# Patient Record
Sex: Male | Born: 1975 | Race: White | Hispanic: No | Marital: Married | Smoking: Never smoker
Health system: Southern US, Community
[De-identification: ages and names within clinical notes are randomized; demographics above are authoritative.]

## PROBLEM LIST (undated history)

## (undated) DIAGNOSIS — E079 Disorder of thyroid, unspecified: Secondary | ICD-10-CM

## (undated) DIAGNOSIS — T7840XA Allergy, unspecified, initial encounter: Secondary | ICD-10-CM

## (undated) HISTORY — PX: TONSILLECTOMY: SUR1361

## (undated) HISTORY — DX: Allergy, unspecified, initial encounter: T78.40XA

## (undated) HISTORY — PX: MANDIBLE SURGERY: SHX707

## (undated) HISTORY — DX: Disorder of thyroid, unspecified: E07.9

---

## 2005-07-15 ENCOUNTER — Encounter: Admission: RE | Admit: 2005-07-15 | Discharge: 2005-07-15 | Payer: Self-pay | Admitting: Family Medicine

## 2012-05-23 HISTORY — PX: BICEPT TENODESIS: SHX5116

## 2013-04-26 ENCOUNTER — Encounter: Payer: Self-pay | Admitting: Gastroenterology

## 2013-06-06 ENCOUNTER — Encounter: Payer: Self-pay | Admitting: Gastroenterology

## 2013-06-06 ENCOUNTER — Ambulatory Visit (INDEPENDENT_AMBULATORY_CARE_PROVIDER_SITE_OTHER): Payer: BC Managed Care – PPO | Admitting: Gastroenterology

## 2013-06-06 VITALS — BP 84/50 | HR 64 | Ht 63.5 in | Wt 129.4 lb

## 2013-06-06 DIAGNOSIS — K5900 Constipation, unspecified: Secondary | ICD-10-CM

## 2013-06-06 MED ORDER — NA SULFATE-K SULFATE-MG SULF 17.5-3.13-1.6 GM/177ML PO SOLN: 1.0000 | Freq: Once | ORAL | Status: DC

## 2013-06-06 NOTE — Progress Notes (Signed)
_                                                                                                                History of Present Illness: Larry Flores 38 year old white male referred for evaluation of constipation.  This has been a problem for years although it seems to be worsening.  He may go over a week without a bowel movement.  With constipation he feels bloated and slightly lethargic.  When he does move his bowels he has a sense of incomplete relaxation.  He denies rectal bleeding.  Family history is pertinent for father who had Crohn's colitis.    History reviewed. No pertinent past medical history. Past Surgical History  Procedure Laterality Date  . Bicept tenodesis Left 05/2012    reattachment surgery  . Mandible surgery    . Tonsillectomy     family history includes Colitis in his father; Crohn's disease in his father; Lung cancer in his maternal grandmother, paternal grandfather, and paternal grandmother; Other in his father and paternal uncle. Current Outpatient Prescriptions  Medication Sig Dispense Refill  . b complex vitamins tablet Take 1 tablet by mouth daily.      . Cholecalciferol (VITAMIN D-3) 5000 UNITS TABS Take 1 tablet by mouth daily.      . Misc Natural Products (TURMERIC CURCUMIN) CAPS Take 1 capsule by mouth daily.      . Pregnenolone POWD 1 tablet by Does not apply route daily.      . vitamin A 10000 UNIT capsule Take 10,000 Units by mouth daily.      . Zinc Acetate, Oral, (ZINC ACETATE PO) Take 1 tablet by mouth daily.       No current facility-administered medications for this visit.   Allergies as of 06/06/2013  . (No Known Allergies)    reports that he has never smoked. He has never used smokeless tobacco. He reports that he drinks alcohol. He reports that he does not use illicit drugs.     Review of Systems: Pertinent positive and negative review of systems were noted in the above HPI section. All other review of systems were  otherwise negative.  Vital signs were reviewed in today's medical record Physical Exam: General: Well developed , well nourished, no acute distress Skin: anicteric Head: Normocephalic and atraumatic Eyes:  sclerae anicteric, EOMI Ears: Normal auditory acuity Mouth: No deformity or lesions Neck: Supple, no masses or thyromegaly Lungs: Clear throughout to auscultation Heart: Regular rate and rhythm; no murmurs, rubs or bruits Abdomen: Soft, non tender and non distended. No masses, hepatosplenomegaly or hernias noted. Normal Bowel sounds Rectal:deferred Musculoskeletal: Symmetrical with no gross deformities  Skin: No lesions on visible extremities Pulses:  Normal pulses noted Extremities: No clubbing, cyanosis, edema or deformities noted Neurological: Alert oriented x 4, grossly nonfocal Cervical Nodes:  No significant cervical adenopathy Inguinal Nodes: No significant inguinal adenopathy Psychological:  Alert and cooperative. Normal mood and affect  See Assessment and Plan under Problem List

## 2013-06-06 NOTE — Assessment & Plan Note (Signed)
Clear history of constipation.  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  A structural abnormality of the colon should be ruled out, especially in view of family history of Crohn's colitis.  Recommendations #1 colonoscopy; if no abnormalities are seen would consider enrollment in CIC. trial

## 2013-06-06 NOTE — Patient Instructions (Signed)

## 2013-06-18 ENCOUNTER — Encounter: Payer: Self-pay | Admitting: Gastroenterology

## 2013-06-26 ENCOUNTER — Encounter: Payer: BC Managed Care – PPO | Admitting: Gastroenterology

## 2013-08-06 ENCOUNTER — Telehealth: Payer: Self-pay | Admitting: Gastroenterology

## 2013-08-06 NOTE — Telephone Encounter (Signed)
Yes unless we were able to fill that slot

## 2013-08-07 ENCOUNTER — Encounter: Payer: BC Managed Care – PPO | Admitting: Gastroenterology

## 2013-09-12 ENCOUNTER — Encounter: Payer: Self-pay | Admitting: Gastroenterology

## 2013-09-12 ENCOUNTER — Ambulatory Visit (AMBULATORY_SURGERY_CENTER): Payer: BC Managed Care – PPO | Admitting: Gastroenterology

## 2013-09-12 VITALS — BP 114/69 | HR 58 | Temp 98.1°F | Resp 21 | Ht 63.0 in | Wt 129.0 lb

## 2013-09-12 DIAGNOSIS — K5900 Constipation, unspecified: Secondary | ICD-10-CM

## 2013-09-12 MED ORDER — SODIUM CHLORIDE 0.9 % IV SOLN: 500.0000 mL | INTRAVENOUS | Status: DC

## 2013-09-12 NOTE — Progress Notes (Signed)
A/ox3, pleased with MAC, report to RN 

## 2013-09-12 NOTE — Op Note (Signed)
[facility]  [address]. [place], [address]   COLONOSCOPY PROCEDURE REPORT  PATIENT: Larry Flores, Larry Flores.  MR#: [id number] BIRTHDATE: 05/23/1975 , 38  yrs. old GENDER: Male ENDOSCOPIST: Inda Castle, MD REFERRED BY: PROCEDURE DATE:  09/12/2013 PROCEDURE:   Colonoscopy, diagnostic First Screening Colonoscopy - Avg.  risk and is 50 yrs.  old or older Yes.  Prior Negative Screening - Now for repeat screening. N/A  History of Adenoma - Now for follow-up colonoscopy & has been > or = to 3 yrs.  N/A  Polyps Removed Today? No.  Recommend repeat exam, <10 yrs? No. ASA CLASS:   Class I INDICATIONS:Constipation. MEDICATIONS: MAC sedation, administered by CRNA and propofol (Diprivan) 200mg  IV  DESCRIPTION OF PROCEDURE:   After the risks benefits and alternatives of the procedure were thoroughly explained, informed consent was obtained.  A digital rectal exam revealed no abnormalities of the rectum.   The [id number]  endoscope was introduced through the anus and advanced to the ileum. No adverse events experienced.   The quality of the prep was excellent using Suprep  The instrument was then slowly withdrawn as the colon was fully examined.      COLON FINDINGS: The mucosa appeared normal in the terminal ileum. A normal appearing cecum, ileocecal valve, and appendiceal orifice were identified.  The ascending, hepatic flexure, transverse, splenic flexure, descending, sigmoid colon and rectum appeared unremarkable.  No polyps or cancers were seen.  Retroflexed views revealed no abnormalities. The time to cecum=3 minutes 49 seconds. Withdrawal time=6 minutes 18 seconds.  The scope was withdrawn and the procedure completed. COMPLICATIONS: There were no complications.  ENDOSCOPIC IMPRESSION: 1.   Normal mucosa in the terminal ileum 2.   Normal colon  RECOMMENDATIONS: High fiber diet with liberal fluid intake. MiraLax every 3 days if no bowel movement Office  visit 2 months   eSigned:  Inda Castle, MD 09/12/2013 2:58 PM   cc: Tomma Lightning Copy Erline Levine, MD   PATIENT NAME:  Larry Flores, Larry Flores. MR#: [id number]

## 2013-09-12 NOTE — Patient Instructions (Signed)
YOU HAD AN ENDOSCOPIC PROCEDURE TODAY AT THE [facility]: Refer to the procedure report that was given to you for any specific questions about what was found during the examination.  If the procedure report does not answer your questions, please call your gastroenterologist to clarify.  If you requested that your care partner not be given the details of your procedure findings, then the procedure report has been included in a sealed envelope for you to review at your convenience later.  YOU SHOULD EXPECT: Some feelings of bloating in the abdomen. Passage of more gas than usual.  Walking can help get rid of the air that was put into your GI tract during the procedure and reduce the bloating. If you had a lower endoscopy (such as a colonoscopy or flexible sigmoidoscopy) you may notice spotting of blood in your stool or on the toilet paper. If you underwent a bowel prep for your procedure, then you may not have a normal bowel movement for a few days.  DIET: Your first meal following the procedure should be a light meal and then it is ok to progress to your normal diet.  A half-sandwich or bowl of soup is an example of a good first meal.  Heavy or fried foods are harder to digest and may make you feel nauseous or bloated.  Likewise meals heavy in dairy and vegetables can cause extra gas to form and this can also increase the bloating.  Drink plenty of fluids but you should avoid alcoholic beverages for 24 hours.  ACTIVITY: Your care partner should take you home directly after the procedure.  You should plan to take it easy, moving slowly for the rest of the day.  You can resume normal activity the day after the procedure however you should NOT DRIVE or use heavy machinery for 24 hours (because of the sedation medicines used during the test).    SYMPTOMS TO REPORT IMMEDIATELY: A gastroenterologist can be reached at any hour.  During normal business hours, 8:30 AM to 5:00 PM Monday through Friday,  call [phone].  After hours and on weekends, please call the GI answering service at [phone] who will take a message and have the physician on call contact you.   Following lower endoscopy (colonoscopy or flexible sigmoidoscopy):  Excessive amounts of blood in the stool  Significant tenderness or worsening of abdominal pains  Swelling of the abdomen that is new, acute  Fever of 100F or higher    FOLLOW UP: If any biopsies were taken you will be contacted by phone or by letter within the next 1-3 weeks.  Call your gastroenterologist if you have not heard about the biopsies in 3 weeks.  Our staff will call the home number listed on your records the next business day following your procedure to check on you and address any questions or concerns that you may have at that time regarding the information given to you following your procedure. This is a courtesy call and so if there is no answer at the home number and we have not heard from you through the emergency physician on call, we will assume that you have returned to your regular daily activities without incident.  SIGNATURES/CONFIDENTIALITY: You and/or your care partner have signed paperwork which will be entered into your electronic medical record.  These signatures attest to the fact that that the information above on your After Visit Summary has been reviewed and is understood.  Full responsibility of the confidentiality   of this discharge information lies with you and/or your care-partner.   HIGH FIBER DIET INFORMATION GIVEN TO YOU TODAY  MIRALAX (OVER THE COUNTER) AS DIRECTED EVERY 3 DAYS IF NO BOWEL MOVEMENT   OFFICE VISIT WITH DR Deatra Ina IN 2 [facility] APPOINT

## 2013-09-13 ENCOUNTER — Telehealth: Payer: Self-pay | Admitting: *Deleted

## 2013-09-13 NOTE — Telephone Encounter (Signed)
Message left

## 2013-11-19 ENCOUNTER — Telehealth: Payer: Self-pay | Admitting: *Deleted

## 2013-11-19 NOTE — Telephone Encounter (Signed)
L./M FOR PATIENT TO CALL BACK AND RESCHEDULE HIS APPOINTMENT     DUE TO ERCP SCHEDULED AT [facility] PER DR PYRTLE

## 2013-11-21 ENCOUNTER — Ambulatory Visit: Payer: BC Managed Care – PPO | Admitting: Gastroenterology

## 2016-08-06 ENCOUNTER — Other Ambulatory Visit: Payer: Self-pay | Admitting: Family Medicine

## 2016-08-06 DIAGNOSIS — M25511 Pain in right shoulder: Secondary | ICD-10-CM

## 2016-08-17 ENCOUNTER — Ambulatory Visit
Admission: RE | Admit: 2016-08-17 | Discharge: 2016-08-17 | Disposition: A | Discharge status: Home / Self Care | Payer: BLUE CROSS/BLUE SHIELD | Source: Ambulatory Visit | Attending: Family Medicine | Admitting: Family Medicine

## 2016-08-17 DIAGNOSIS — M25511 Pain in right shoulder: Secondary | ICD-10-CM

## 2016-11-16 DIAGNOSIS — M75121 Complete rotator cuff tear or rupture of right shoulder, not specified as traumatic: Secondary | ICD-10-CM | POA: Insufficient documentation

## 2016-11-16 DIAGNOSIS — M24111 Other articular cartilage disorders, right shoulder: Secondary | ICD-10-CM | POA: Insufficient documentation

## 2016-11-16 DIAGNOSIS — M67921 Unspecified disorder of synovium and tendon, right upper arm: Secondary | ICD-10-CM | POA: Insufficient documentation

## 2017-01-10 DIAGNOSIS — M7501 Adhesive capsulitis of right shoulder: Secondary | ICD-10-CM | POA: Insufficient documentation

## 2017-02-22 HISTORY — PX: OTHER SURGICAL HISTORY: SHX169

## 2019-02-19 ENCOUNTER — Ambulatory Visit: Payer: BC Managed Care – PPO | Attending: Internal Medicine

## 2019-02-19 DIAGNOSIS — Z20822 Contact with and (suspected) exposure to covid-19: Secondary | ICD-10-CM

## 2019-02-21 LAB — NOVEL CORONAVIRUS, NAA: SARS-CoV-2, NAA: NOT DETECTED

## 2019-07-25 DIAGNOSIS — S62617D Displaced fracture of proximal phalanx of left little finger, subsequent encounter for fracture with routine healing: Secondary | ICD-10-CM | POA: Diagnosis not present

## 2019-08-22 DIAGNOSIS — S62617D Displaced fracture of proximal phalanx of left little finger, subsequent encounter for fracture with routine healing: Secondary | ICD-10-CM | POA: Diagnosis not present

## 2020-01-14 DIAGNOSIS — E039 Hypothyroidism, unspecified: Secondary | ICD-10-CM | POA: Diagnosis not present

## 2020-01-14 DIAGNOSIS — E291 Testicular hypofunction: Secondary | ICD-10-CM | POA: Diagnosis not present

## 2020-01-14 DIAGNOSIS — E538 Deficiency of other specified B group vitamins: Secondary | ICD-10-CM | POA: Diagnosis not present

## 2020-01-14 DIAGNOSIS — E559 Vitamin D deficiency, unspecified: Secondary | ICD-10-CM | POA: Diagnosis not present

## 2020-02-13 DIAGNOSIS — Z20822 Contact with and (suspected) exposure to covid-19: Secondary | ICD-10-CM | POA: Diagnosis not present

## 2020-03-12 DIAGNOSIS — Z0001 Encounter for general adult medical examination with abnormal findings: Secondary | ICD-10-CM | POA: Diagnosis not present

## 2020-03-12 DIAGNOSIS — R945 Abnormal results of liver function studies: Secondary | ICD-10-CM | POA: Diagnosis not present

## 2020-03-12 DIAGNOSIS — E291 Testicular hypofunction: Secondary | ICD-10-CM | POA: Diagnosis not present

## 2020-03-12 DIAGNOSIS — E7212 Methylenetetrahydrofolate reductase deficiency: Secondary | ICD-10-CM | POA: Diagnosis not present

## 2021-04-03 DIAGNOSIS — R945 Abnormal results of liver function studies: Secondary | ICD-10-CM | POA: Diagnosis not present

## 2021-04-15 DIAGNOSIS — Z20822 Contact with and (suspected) exposure to covid-19: Secondary | ICD-10-CM | POA: Diagnosis not present

## 2021-04-15 DIAGNOSIS — J029 Acute pharyngitis, unspecified: Secondary | ICD-10-CM | POA: Diagnosis not present

## 2021-04-15 DIAGNOSIS — J3489 Other specified disorders of nose and nasal sinuses: Secondary | ICD-10-CM | POA: Diagnosis not present

## 2021-04-15 DIAGNOSIS — R0981 Nasal congestion: Secondary | ICD-10-CM | POA: Diagnosis not present

## 2021-04-23 DIAGNOSIS — F4329 Adjustment disorder with other symptoms: Secondary | ICD-10-CM | POA: Diagnosis not present

## 2021-04-23 DIAGNOSIS — E291 Testicular hypofunction: Secondary | ICD-10-CM | POA: Diagnosis not present

## 2021-04-23 DIAGNOSIS — E039 Hypothyroidism, unspecified: Secondary | ICD-10-CM | POA: Diagnosis not present

## 2021-04-23 DIAGNOSIS — R5383 Other fatigue: Secondary | ICD-10-CM | POA: Diagnosis not present

## 2021-04-23 DIAGNOSIS — R140 Abdominal distension (gaseous): Secondary | ICD-10-CM | POA: Diagnosis not present

## 2021-04-23 DIAGNOSIS — Z0001 Encounter for general adult medical examination with abnormal findings: Secondary | ICD-10-CM | POA: Diagnosis not present

## 2021-05-07 DIAGNOSIS — D72829 Elevated white blood cell count, unspecified: Secondary | ICD-10-CM | POA: Diagnosis not present

## 2021-07-10 ENCOUNTER — Encounter: Payer: Self-pay | Admitting: Family Medicine

## 2021-07-10 ENCOUNTER — Ambulatory Visit (INDEPENDENT_AMBULATORY_CARE_PROVIDER_SITE_OTHER): Payer: BC Managed Care – PPO

## 2021-07-10 ENCOUNTER — Ambulatory Visit (INDEPENDENT_AMBULATORY_CARE_PROVIDER_SITE_OTHER): Payer: BC Managed Care – PPO | Admitting: Family Medicine

## 2021-07-10 VITALS — BP 110/80 | HR 92 | Ht 63.0 in | Wt 130.4 lb

## 2021-07-10 DIAGNOSIS — S92213A Displaced fracture of cuboid bone of unspecified foot, initial encounter for closed fracture: Secondary | ICD-10-CM | POA: Insufficient documentation

## 2021-07-10 DIAGNOSIS — S99911A Unspecified injury of right ankle, initial encounter: Secondary | ICD-10-CM | POA: Diagnosis not present

## 2021-07-10 DIAGNOSIS — S92214A Nondisplaced fracture of cuboid bone of right foot, initial encounter for closed fracture: Secondary | ICD-10-CM

## 2021-07-10 DIAGNOSIS — M79671 Pain in right foot: Secondary | ICD-10-CM

## 2021-07-10 NOTE — Patient Instructions (Addendum)
Nice to meet you.  Please look into a CAM walker boot and an ASO ankle brace.  Follow-up: 2 weeks

## 2021-07-10 NOTE — Progress Notes (Signed)
   I, Wendy Poet, LAT, ATC, am serving as scribe for Dr. Lynne Leader.  Subjective:    CC: R foot pain  HPI: Pt is a 46 y/o male c/o R foot pain since 07/09/21 when he injured his foot while playing soccer. He rolled his R ankle and foot into inversion and heard/felt a pop. Pt locates pain to his R lateral, proximal foot near the cuboid/proximal 5th MT.   R foot swelling: yes Aggravating factors: walking/weight-bearing; R toe ext especially toes 4 and 5 Treatments tried: ice; IBU  Pertinent review of Systems: No fever or chills  Relevant historical information: History of right adhesive capsulitis   Objective:    Vitals:   07/10/21 1135  BP: 110/80  Pulse: 92  SpO2: 97%   General: Well Developed, well nourished, and in no acute distress.   MSK: Right foot swelling dorsal lateral midfoot otherwise normal. Normal foot and ankle motion. Tender palpation overlying cuboid. Stable ligamentous exam. Intact strength.  Pain with resisted foot eversion.  Lab and Radiology Results  X-ray images right foot and ankle obtained today personally and independently interpreted  Right ankle: No acute fractures are visible.  No significant degenerative changes.  Right foot: Avulsion fracture present cuboid laterally.  Age-indeterminate.  Await formal radiology review    Impression and Recommendations:    Assessment and Plan: 46 y.o. male with right lateral foot pain after an inversion injury.  X-ray is concerning for an avulsion fracture however the edges of the avulsion fragment are rounded which may indicate this is an old injury.  He could have had an exacerbation of an old avulsion fracture or just an unusual appearing new avulsion fracture.  Regardless his pain does correspond to the avulsion fragment seen on x-ray.  We will treat as though a cuboid avulsion fracture with Cam walker.  Recheck in 2 weeks.  Recommend transitioning to ASO ankle brace when able.   PDMP not reviewed  this encounter. Orders Placed This Encounter  Procedures   DG Ankle Complete Right    Standing Status:   Future    Number of Occurrences:   1    Standing Expiration Date:   08/10/2021    Order Specific Question:   Reason for Exam (SYMPTOM  OR DIAGNOSIS REQUIRED)    Answer:   R foot pain    Order Specific Question:   Preferred imaging location?    Answer:   [facility]   DG Foot Complete Right    Standing Status:   Future    Number of Occurrences:   1    Standing Expiration Date:   08/10/2021    Order Specific Question:   Reason for Exam (SYMPTOM  OR DIAGNOSIS REQUIRED)    Answer:   R foot pain    Order Specific Question:   Preferred imaging location?    Answer:   [facility]   No orders of the defined types were placed in this encounter.   Discussed warning signs or symptoms. Please see discharge instructions. Patient expresses understanding.   The above documentation has been reviewed and is accurate and complete Lynne Leader, M.D.

## 2021-07-13 NOTE — Progress Notes (Signed)
Right foot x-ray shows a cuboid fracture like we talked about in clinic.

## 2021-07-13 NOTE — Progress Notes (Signed)
Right ankle x-ray shows a suspected cuboid fracture like we talked about in clinic.

## 2021-07-24 ENCOUNTER — Ambulatory Visit: Payer: BC Managed Care – PPO | Admitting: Family Medicine

## 2021-12-03 ENCOUNTER — Encounter: Payer: Self-pay | Admitting: Gastroenterology

## 2021-12-17 ENCOUNTER — Telehealth: Payer: Self-pay | Admitting: *Deleted

## 2021-12-17 NOTE — Telephone Encounter (Signed)
Patient had normal colonoscopy at 38 to assess for constipation. Just wanted to verify it is ok for him to now come in for screening?

## 2021-12-17 NOTE — Telephone Encounter (Signed)
Chart reviewed.  Okay to proceed with colonoscopy for direct access for age-appropriate CRC screening

## 2021-12-23 ENCOUNTER — Ambulatory Visit (AMBULATORY_SURGERY_CENTER): Payer: Self-pay

## 2021-12-23 VITALS — Ht 64.0 in | Wt 129.0 lb

## 2021-12-23 DIAGNOSIS — Z1211 Encounter for screening for malignant neoplasm of colon: Secondary | ICD-10-CM

## 2021-12-23 MED ORDER — NA SULFATE-K SULFATE-MG SULF 17.5-3.13-1.6 GM/177ML PO SOLN: 1.0000 | Freq: Once | ORAL | 0 refills | Status: AC

## 2021-12-23 NOTE — Progress Notes (Signed)

## 2022-01-13 ENCOUNTER — Encounter: Payer: Self-pay | Admitting: Gastroenterology

## 2022-01-20 ENCOUNTER — Encounter: Payer: Self-pay | Admitting: Certified Registered Nurse Anesthetist

## 2022-01-21 ENCOUNTER — Encounter: Payer: Self-pay | Admitting: Gastroenterology

## 2022-01-21 ENCOUNTER — Ambulatory Visit (AMBULATORY_SURGERY_CENTER): Payer: BC Managed Care – PPO | Admitting: Gastroenterology

## 2022-01-21 VITALS — BP 113/66 | HR 66 | Temp 95.7°F | Resp 12 | Ht 64.0 in | Wt 129.0 lb

## 2022-01-21 DIAGNOSIS — D122 Benign neoplasm of ascending colon: Secondary | ICD-10-CM

## 2022-01-21 DIAGNOSIS — Z1211 Encounter for screening for malignant neoplasm of colon: Secondary | ICD-10-CM

## 2022-01-21 MED ORDER — SODIUM CHLORIDE 0.9 % IV SOLN: 500.0000 mL | INTRAVENOUS | Status: DC

## 2022-01-21 NOTE — Progress Notes (Signed)
Called to room to assist during endoscopic procedure.  Patient ID and intended procedure confirmed with present staff. Received instructions for my participation in the procedure from the performing physician.  

## 2022-01-21 NOTE — Op Note (Signed)
[facility] Patient Name: Larry Flores Procedure Date: 01/21/2022 9:42 AM MRN: [id number] Endoscopist: Gerrit Heck , MD, [id number] Age: 46 Referring MD:  Date of Birth: 03/30/1975 Gender: Male Account #: [id number] Procedure:                Colonoscopy Indications:              Screening for colorectal malignant neoplasm Medicines:                Monitored Anesthesia Care Procedure:                Pre-Anesthesia Assessment:                           - Prior to the procedure, a History and Physical                            was performed, and patient medications and                            allergies were reviewed. The patient's tolerance of                            previous anesthesia was also reviewed. The risks                            and benefits of the procedure and the sedation                            options and risks were discussed with the patient.                            All questions were answered, and informed consent                            was obtained. Prior Anticoagulants: The patient has                            taken no anticoagulant or antiplatelet agents. ASA                            Grade Assessment: II - A patient with mild systemic                            disease. After reviewing the risks and benefits,                            the patient was deemed in satisfactory condition to                            undergo the procedure.                           After obtaining informed consent, the colonoscope  was passed under direct vision. Throughout the                            procedure, the patient's blood pressure, pulse, and                            oxygen saturations were monitored continuously. The                            CF [id number] #[id number] was introduced through the anus                            and advanced to the the terminal ileum. The                            colonoscopy was  performed without difficulty. The                            patient tolerated the procedure well. The quality                            of the bowel preparation was good. The terminal                            ileum, ileocecal valve, appendiceal orifice, and                            rectum were photographed. Scope In: 9:44:01 AM Scope Out: 10:01:08 AM Scope Withdrawal Time: 0 hours 11 minutes 17 seconds  Total Procedure Duration: 0 hours 17 minutes 7 seconds  Findings:                 The perianal and digital rectal examinations were                            normal.                           A 6 mm polyp was found in the ascending colon. The                            polyp was sessile. The polyp was removed with a                            cold snare. Resection and retrieval were complete.                            Estimated blood loss was minimal.                           The sigmoid colon revealed significantly excessive                            looping. Advancing the scope required using manual  pressure.                           The retroflexed view of the distal rectum and anal                            verge was normal and showed no anal or rectal                            abnormalities.                           The terminal ileum appeared normal. Complications:            No immediate complications. Estimated Blood Loss:     Estimated blood loss was minimal. Impression:               - One 6 mm polyp in the ascending colon, removed                            with a cold snare. Resected and retrieved.                           - There was significant looping of the colon.                           - The distal rectum and anal verge are normal on                            retroflexion view.                           - The examined portion of the ileum was normal. Recommendation:           - Patient has a contact number available for                             emergencies. The signs and symptoms of potential                            delayed complications were discussed with the                            patient. Return to normal activities tomorrow.                            Written discharge instructions were provided to the                            patient.                           - Resume previous diet.                           - Continue present medications.                           -  Await pathology results.                           - Repeat colonoscopy for surveillance based on                            pathology results.                           - Return to GI office PRN. Gerrit Heck, MD 01/21/2022 10:06:06 AM

## 2022-01-21 NOTE — Progress Notes (Signed)
Report given to PACU, vss 

## 2022-01-21 NOTE — Progress Notes (Signed)
GASTROENTEROLOGY PROCEDURE H&P NOTE   Primary Care Physician: Zane Herald, MD    Reason for Procedure:  Colon Cancer screening  Plan:    Colonoscopy  Patient is appropriate for endoscopic procedure(s) in the ambulatory ([facility]) setting.  The nature of the procedure, as well as the risks, benefits, and alternatives were carefully and thoroughly reviewed with the patient. Ample time for discussion and questions allowed. The patient understood, was satisfied, and agreed to proceed.     HPI: Larry Flores is a 46 y.o. male who presents for colonoscopy for routine Colon Cancer screening.  No active GI symptoms.  Fhx notable for father w/ Crohns Diseas. No known family history of colon cancer or related malignancy.    Past Medical History:  Diagnosis Date   Allergy    Thyroid disease     Past Surgical History:  Procedure Laterality Date   BICEPT TENODESIS Left 05/23/2012   reattachment surgery   MANDIBLE SURGERY     rotater cuff Right 2019   TONSILLECTOMY      Prior to Admission medications   Medication Sig Start Date End Date Taking? Authorizing Provider  b complex vitamins tablet Take 1 tablet by mouth daily.   Yes [provider]  Cholecalciferol (VITAMIN D-3) 5000 UNITS TABS Take 1 tablet by mouth daily.   Yes [provider]  Misc Natural Products (TURMERIC CURCUMIN) CAPS Take 1 capsule by mouth daily.   Yes [provider]  NP THYROID 30 MG tablet Take 30 mg by mouth daily. 12/09/21  Yes [provider]  Pregnenolone POWD 1 tablet by Does not apply route daily.   Yes [provider]  Zinc Acetate, Oral, (ZINC ACETATE PO) Take 1 tablet by mouth daily.   Yes [provider]  testosterone cypionate (DEPOTESTOTERONE CYPIONATE) 100 MG/ML injection Inject into the muscle.    [provider]    Current Outpatient Medications  Medication Sig Dispense Refill   b complex vitamins tablet Take 1 tablet by mouth  daily.     Cholecalciferol (VITAMIN D-3) 5000 UNITS TABS Take 1 tablet by mouth daily.     Misc Natural Products (TURMERIC CURCUMIN) CAPS Take 1 capsule by mouth daily.     NP THYROID 30 MG tablet Take 30 mg by mouth daily.     Pregnenolone POWD 1 tablet by Does not apply route daily.     Zinc Acetate, Oral, (ZINC ACETATE PO) Take 1 tablet by mouth daily.     testosterone cypionate (DEPOTESTOTERONE CYPIONATE) 100 MG/ML injection Inject into the muscle.     Current Facility-Administered Medications  Medication Dose Route Frequency Provider Last Rate Last Admin   0.9 %  sodium chloride infusion  500 mL Intravenous Continuous Ellanor Feuerstein V, DO        Allergies as of 01/21/2022   (No Known Allergies)    Family History  Problem Relation Age of Onset   Crohn's disease Father    Colitis Father    Other Father        brain tumor   Other Paternal Uncle        brain tumor   Lung cancer Maternal Grandmother    Lung cancer Paternal Grandmother    Lung cancer Paternal Grandfather    Colon cancer Neg Hx    Colon polyps Neg Hx    Esophageal cancer Neg Hx    Stomach cancer Neg Hx    Rectal cancer Neg Hx     Social  History   Socioeconomic History   Marital status: Married    Spouse name: Not on file   Number of children: 2   Years of education: Not on file   Highest education level: Not on file  Occupational History   Occupation: Games developer: [employer]  Tobacco Use   Smoking status: Never   Smokeless tobacco: Never  Substance and Sexual Activity   Alcohol use: Yes    Comment: ocassional   Drug use: No   Sexual activity: Not on file  Other Topics Concern   Not on file  Social History Narrative   Not on file   Social Determinants of Health   Financial Resource Strain: Not on file  Food Insecurity: Not on file  Transportation Needs: Not on file  Physical Activity: Not on file  Stress: Not on file  Social Connections: Not on file  Intimate Partner Violence:  Not on file    Physical Exam: Vital signs in last 24 hours: '@BP'$  (!) 145/82   Pulse 78   Temp (!) 95.7 F (35.4 C)   Ht '5\' 4"'$  (1.626 m)   Wt 129 lb (58.5 kg)   SpO2 100%   BMI 22.14 kg/m  GEN: NAD EYE: Sclerae anicteric ENT: MMM CV: Non-tachycardic Pulm: CTA b/l GI: Soft, NT/ND NEURO:  Alert & Oriented x [place], DO [facility]   01/21/2022 9:33 AM

## 2022-01-21 NOTE — Patient Instructions (Signed)
Please read handouts provided. Continue present medications. Await pathology results. Return to GI office as needed.   YOU HAD AN ENDOSCOPIC PROCEDURE TODAY AT [facility]:   Refer to the procedure report that was given to you for any specific questions about what was found during the examination.  If the procedure report does not answer your questions, please call your gastroenterologist to clarify.  If you requested that your care partner not be given the details of your procedure findings, then the procedure report has been included in a sealed envelope for you to review at your convenience later.  YOU SHOULD EXPECT: Some feelings of bloating in the abdomen. Passage of more gas than usual.  Walking can help get rid of the air that was put into your GI tract during the procedure and reduce the bloating. If you had a lower endoscopy (such as a colonoscopy or flexible sigmoidoscopy) you may notice spotting of blood in your stool or on the toilet paper. If you underwent a bowel prep for your procedure, you may not have a normal bowel movement for a few days.  Please Note:  You might notice some irritation and congestion in your nose or some drainage.  This is from the oxygen used during your procedure.  There is no need for concern and it should clear up in a day or so.  SYMPTOMS TO REPORT IMMEDIATELY:  Following lower endoscopy (colonoscopy or flexible sigmoidoscopy):  Excessive amounts of blood in the stool  Significant tenderness or worsening of abdominal pains  Swelling of the abdomen that is new, acute  Fever of 100F or higher  For urgent or emergent issues, a gastroenterologist can be reached at any hour by calling [phone]. Do not use MyChart messaging for urgent concerns.    DIET:  We do recommend a small meal at first, but then you may proceed to your regular diet.  Drink plenty of fluids but you should avoid alcoholic beverages for 24 hours.  ACTIVITY:  You  should plan to take it easy for the rest of today and you should NOT DRIVE or use heavy machinery until tomorrow (because of the sedation medicines used during the test).    FOLLOW UP: Our staff will call the number listed on your records the next business day following your procedure.  We will call around 7:15- 8:00 am to check on you and address any questions or concerns that you may have regarding the information given to you following your procedure. If we do not reach you, we will leave a message.     If any biopsies were taken you will be contacted by phone or by letter within the next 1-3 weeks.  Please call us at [phone] if you have not heard about the biopsies in 3 weeks.    SIGNATURES/CONFIDENTIALITY: You and/or your care partner have signed paperwork which will be entered into your electronic medical record.  These signatures attest to the fact that that the information above on your After Visit Summary has been reviewed and is understood.  Full responsibility of the confidentiality of this discharge information lies with you and/or your care-partner.

## 2022-01-22 ENCOUNTER — Telehealth: Payer: Self-pay | Admitting: *Deleted

## 2022-01-22 NOTE — Telephone Encounter (Signed)
Attempted to call patient for their post-procedure follow-up call. No answer. Left voicemail.   

## 2022-01-29 ENCOUNTER — Encounter: Payer: Self-pay | Admitting: Gastroenterology

## 2023-11-29 IMAGING — DX DG FOOT COMPLETE 3+V*R*
3 series · 3 of 3 positions shown · non-contrast
Comparison: None Available.

CLINICAL DATA: Right foot pain

EXAM:
RIGHT FOOT COMPLETE - 3+ VIEW

[foot ap]
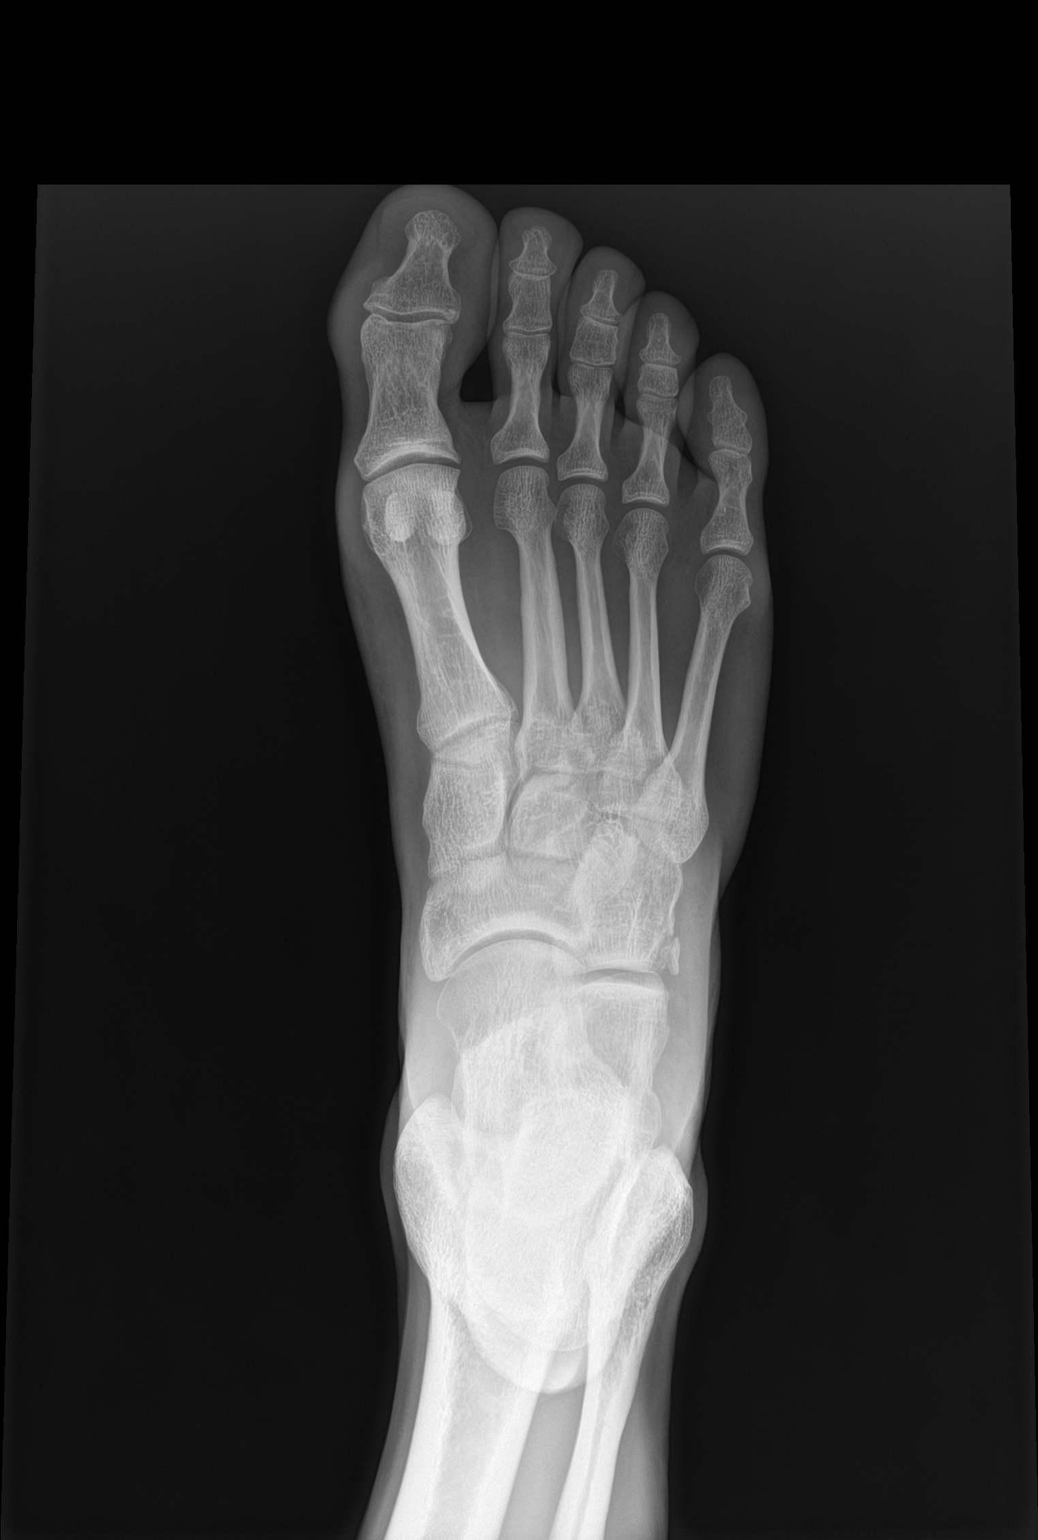

[foot obl]
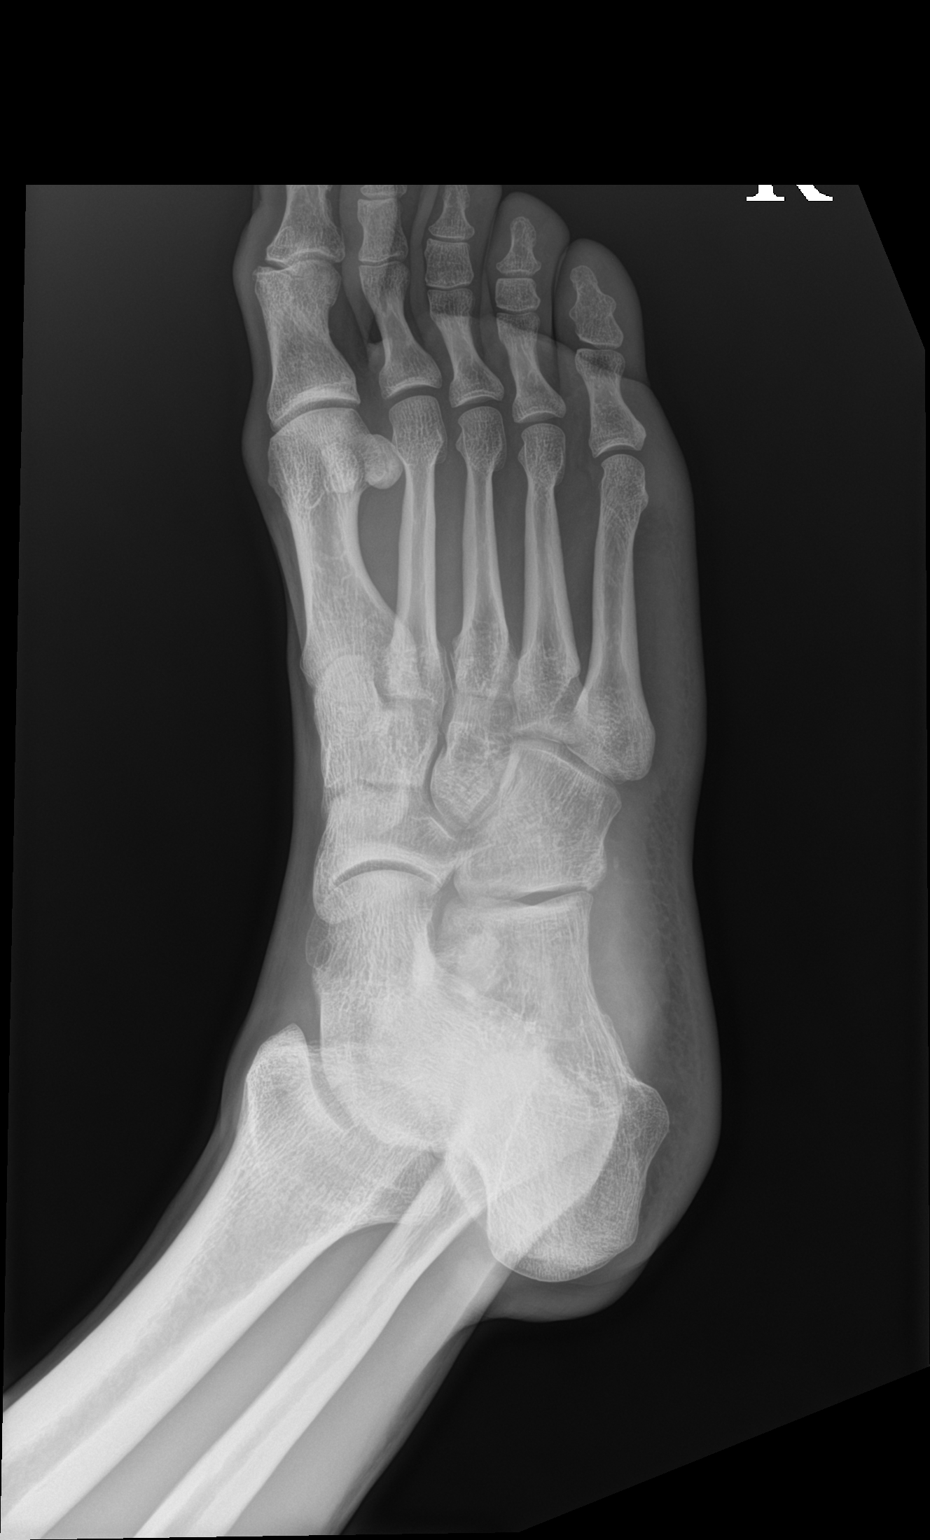

[foot lat]
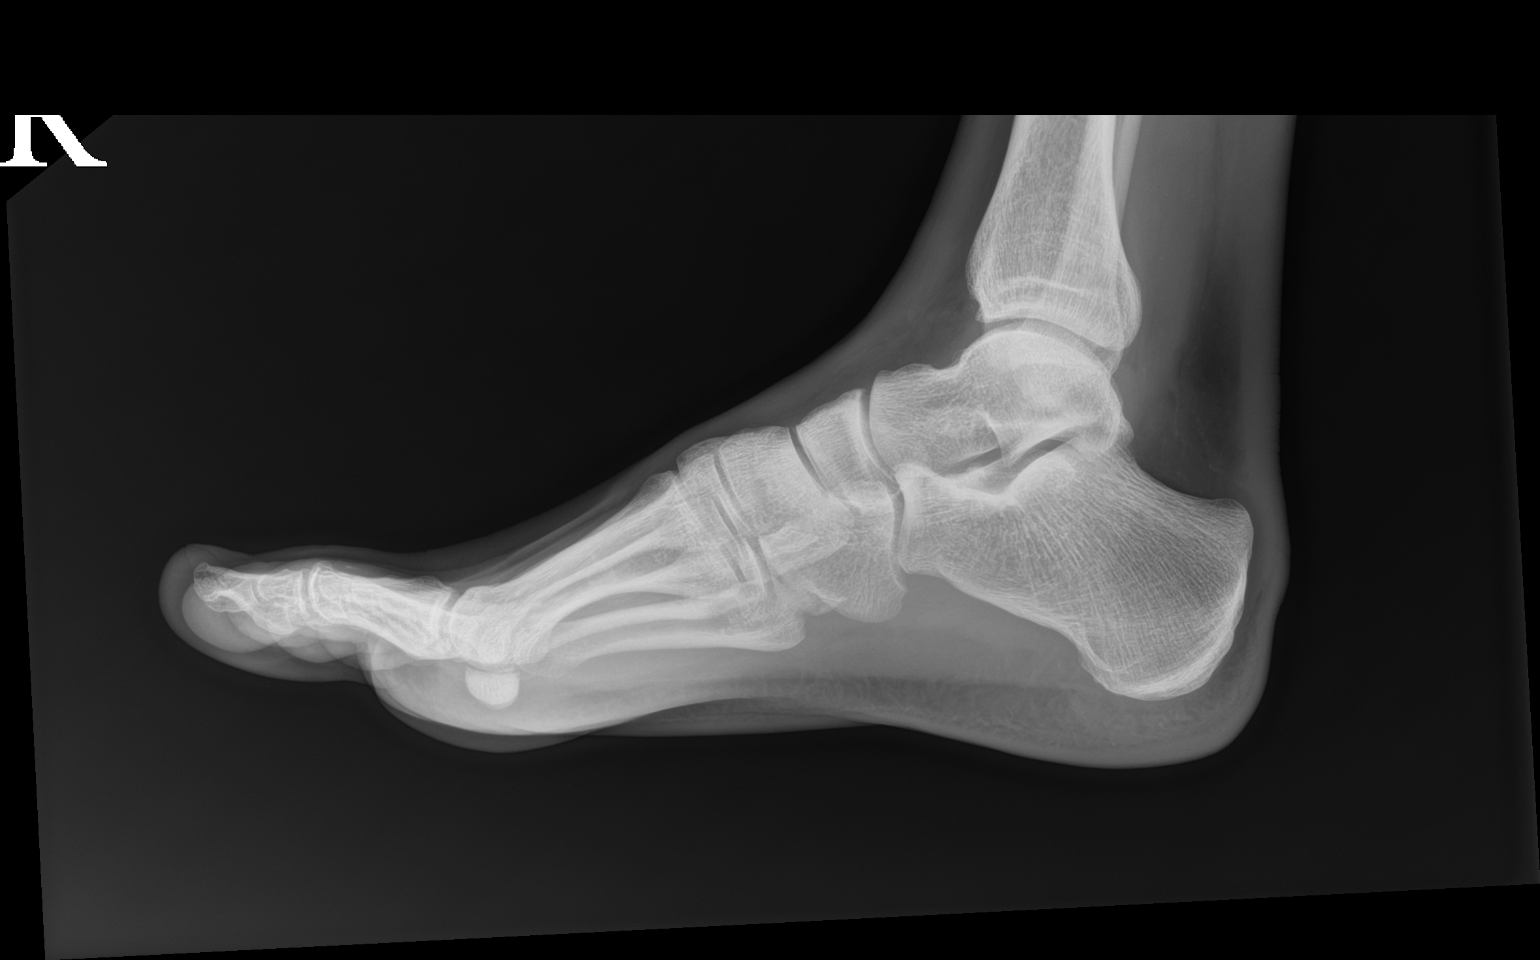

[3 of 3 positions shown; findings below may reference images not displayed]

FINDINGS: There is an acute appearing minimally displaced fracture at the
proximal lateral aspect of the cuboid bone. No additional fracture
or dislocation identified in the foot. No significant soft tissue
swelling visualized.
IMPRESSION: Acute appearing minimally displaced fracture at the lateral cuboid
bone, correlate with point tenderness.

These results will be called to the ordering clinician or
representative by the Radiologist Assistant, and communication
documented in the PACS or [REDACTED].

## 2023-11-29 IMAGING — DX DG ANKLE COMPLETE 3+V*R*
3 series · 3 of 3 positions shown · non-contrast
Comparison: None Available.

CLINICAL DATA: Right ankle injury

EXAM:
RIGHT ANKLE - COMPLETE 3+ VIEW

[ankle ap]
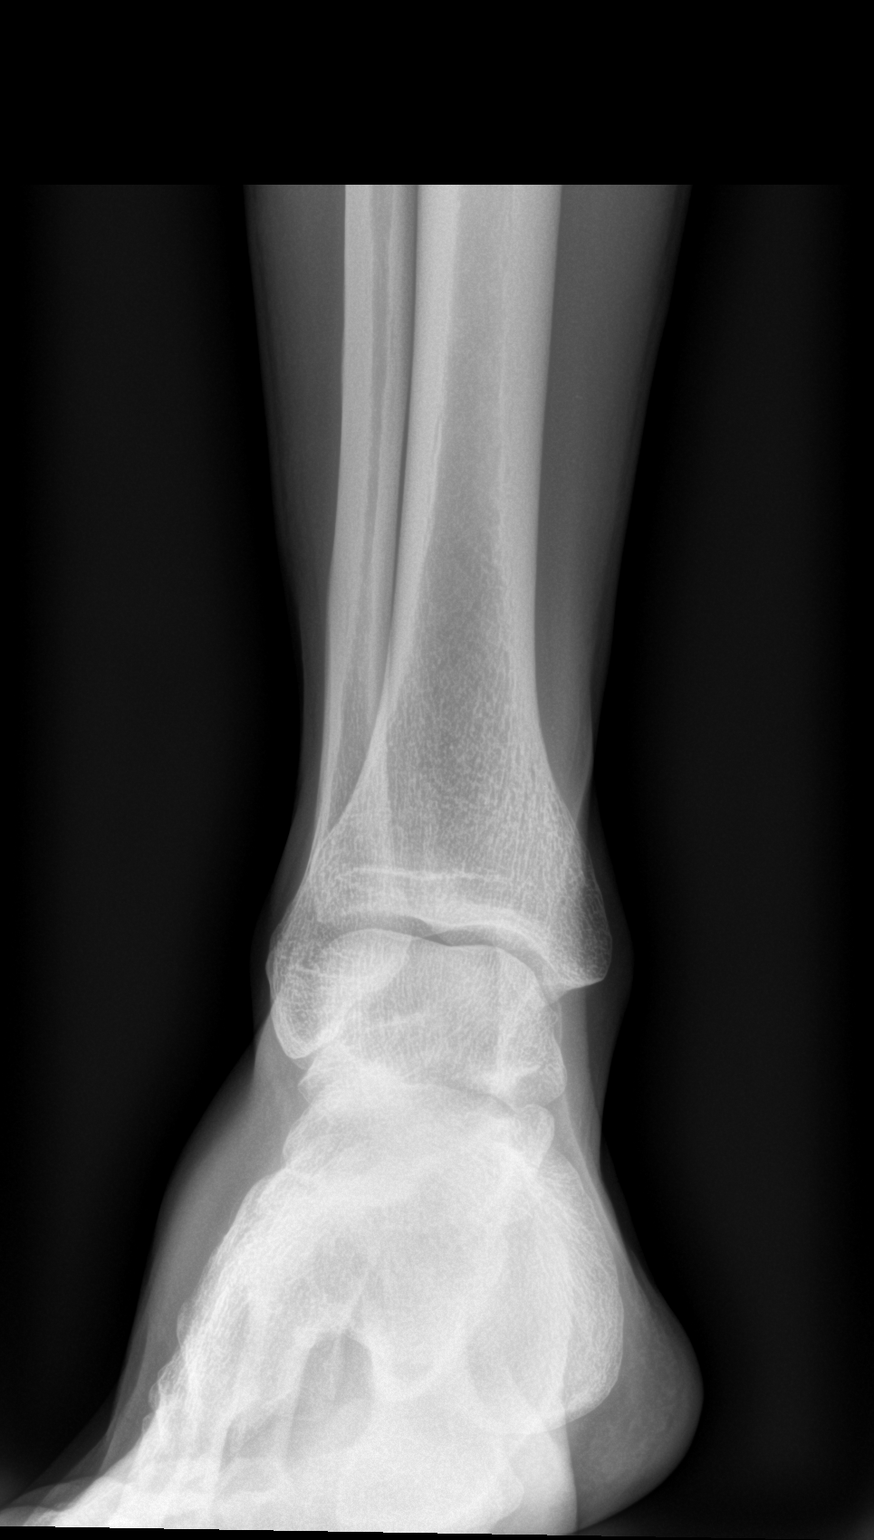

[ankle obl]
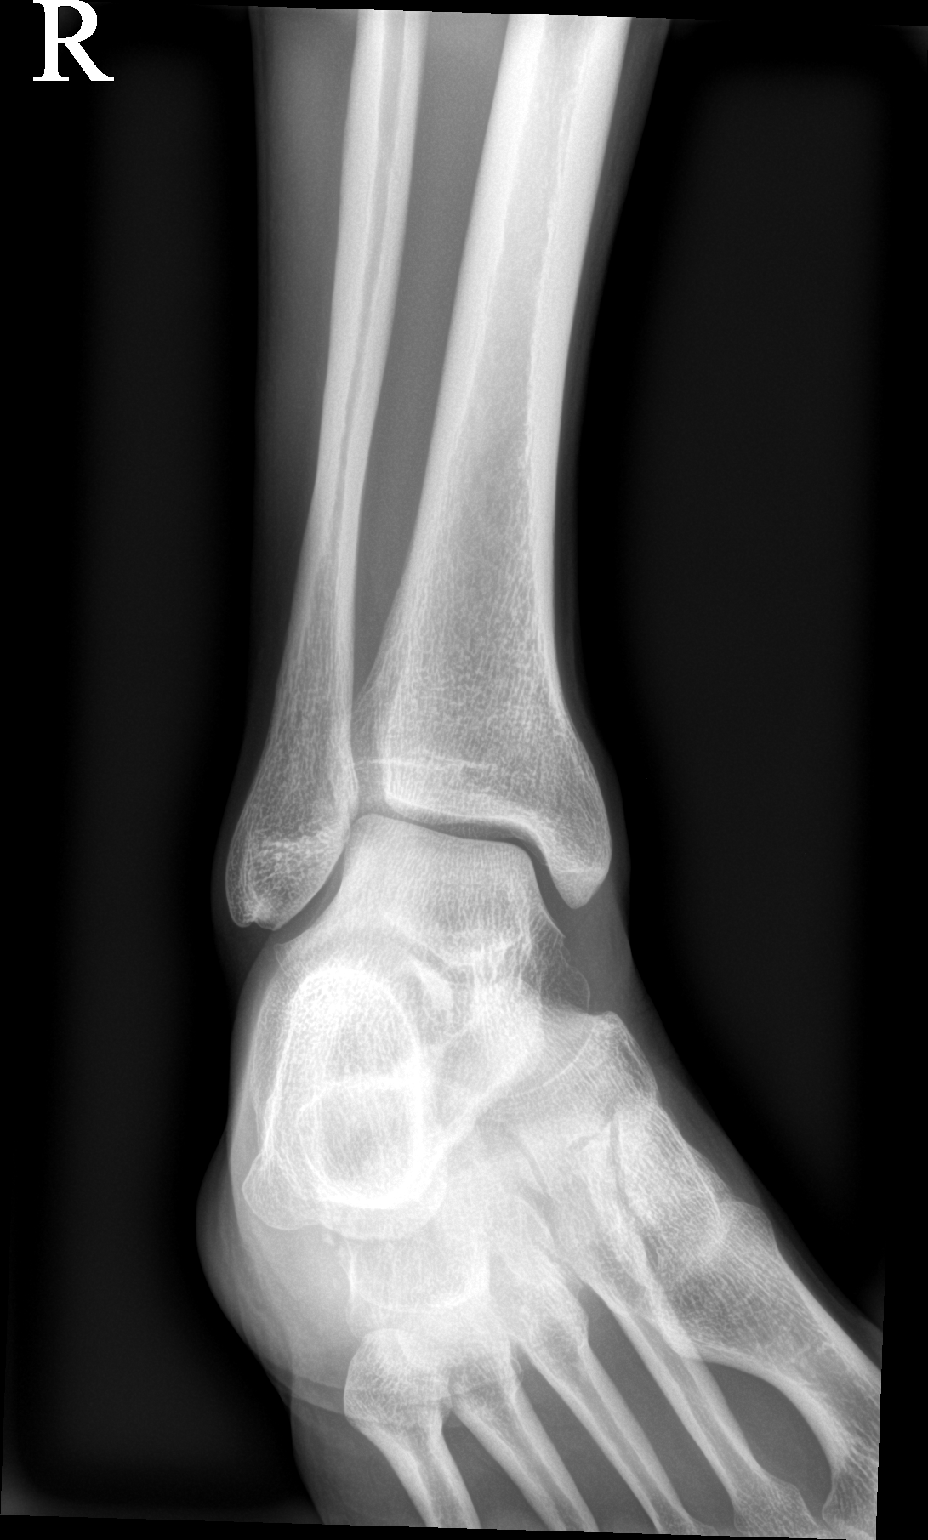

[ankle lat]
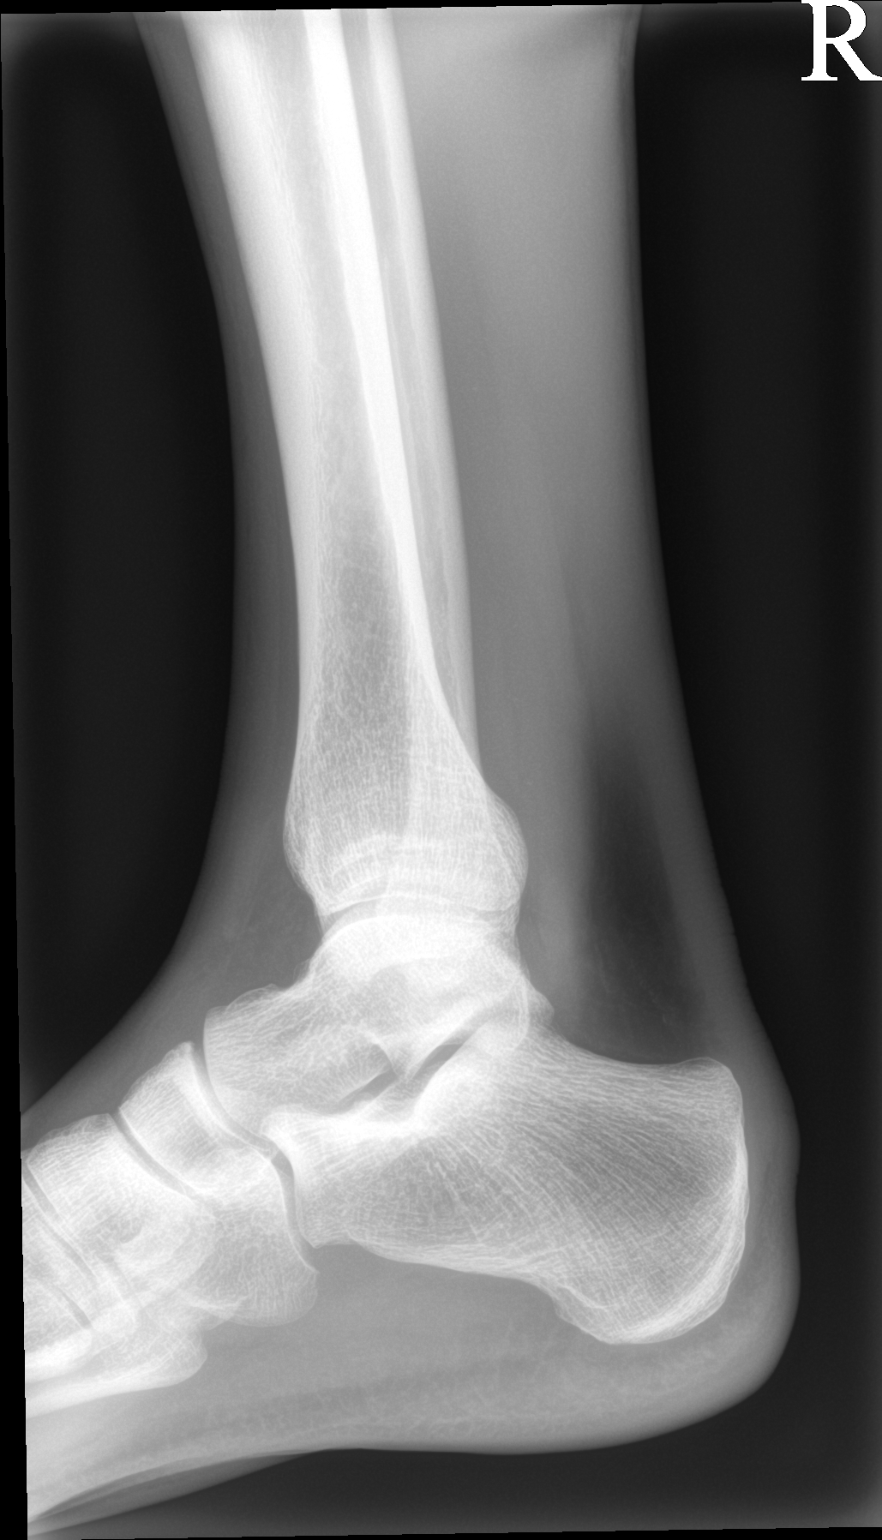

[3 of 3 positions shown; findings below may reference images not displayed]

FINDINGS: No acute fracture or dislocation identified in the ankle. Ankle
mortise is preserved. No significant soft tissue swelling.

There appears to be a possible acute fracture of the lateral aspect
of the cuboid bone.
IMPRESSION: 1. No acute fracture identified in the ankle.
2. Suspected acute fracture of the lateral aspect of the cuboid
bone.
